# Patient Record
Sex: Male | Born: 2000 | Race: White | Hispanic: No | Marital: Single | Smoking: Never smoker
Health system: Southern US, Community
[De-identification: ages and names within clinical notes are randomized; demographics above are authoritative.]

## PROBLEM LIST (undated history)

## (undated) DIAGNOSIS — F909 Attention-deficit hyperactivity disorder, unspecified type: Secondary | ICD-10-CM

---
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
## 2018-04-17 ENCOUNTER — Ambulatory Visit (INDEPENDENT_AMBULATORY_CARE_PROVIDER_SITE_OTHER): Payer: PRIVATE HEALTH INSURANCE | Admitting: Family Medicine

## 2018-04-17 ENCOUNTER — Encounter: Payer: Self-pay | Admitting: Family Medicine

## 2018-04-17 VITALS — BP 120/82 | HR 87 | Temp 98.6°F | Wt 152.0 lb

## 2018-04-17 DIAGNOSIS — L723 Sebaceous cyst: Secondary | ICD-10-CM

## 2018-04-17 NOTE — Progress Notes (Signed)
   Subjective:    Patient ID: Keith Banks, male    DOB: 2000-05-27, 18 y.o.   MRN: [id number]  HPI He is here for evaluation of the lesion present in the upper gluteal cleft area.  It is slightly uncomfortable and has gotten a little bit bigger in the last several days.   Review of Systems     Objective:   Physical Exam 1-1/2 cm round smooth reddish-purple lesion with no surrounding induration is noted in the gluteal crease superiorly.       Assessment & Plan:  Sebaceous cyst The area was frozen with ethyl chloride and a 1 cm incision was made.  No purulent material was expressed but a whitish-yellow lesion was extruded from the area.  It appeared to be a cystic type lesion. Recommend irrigating this several times per day and return here if further problems. I did not feel as if this was a pilonidal cyst but that certainly a possibility.  It was very superficial.

## 2018-05-04 ENCOUNTER — Telehealth: Payer: Self-pay | Admitting: Family Medicine

## 2018-05-04 NOTE — Telephone Encounter (Signed)
Received a response concerning medical records request. Per fax no records available. Unable to locate pt.

## 2018-05-24 ENCOUNTER — Telehealth: Payer: Self-pay | Admitting: Family Medicine

## 2018-05-24 NOTE — Telephone Encounter (Signed)
Received requested records from [facility]. Sending back for review.

## 2018-05-29 ENCOUNTER — Ambulatory Visit (INDEPENDENT_AMBULATORY_CARE_PROVIDER_SITE_OTHER): Payer: PRIVATE HEALTH INSURANCE | Admitting: Family Medicine

## 2018-05-29 ENCOUNTER — Encounter: Payer: Self-pay | Admitting: Family Medicine

## 2018-05-29 VITALS — BP 120/90 | HR 102 | Temp 102.0°F | Resp 16 | Ht 67.5 in | Wt 159.8 lb

## 2018-05-29 DIAGNOSIS — F909 Attention-deficit hyperactivity disorder, unspecified type: Secondary | ICD-10-CM | POA: Diagnosis not present

## 2018-05-29 MED ORDER — METHYLPHENIDATE HCL 10 MG PO TABS: 10.0000 mg | ORAL_TABLET | Freq: Two times a day (BID) | ORAL | 0 refills | Status: AC

## 2018-05-29 NOTE — Progress Notes (Signed)
   Subjective:    Patient ID: Keith Banks, male    DOB: [birth year], 18 y.o.   MRN: [id number]  HPI He is here for consult concerning ADHD.  He apparently was diagnosed while in grade school and from grade 3-12 was on Vyvanse.  He did have unacceptable side effects of that medicine causing him to be moody and did not continue on it.  High school was not a problem as he got B's and did not have to try.  He is now in college and notes he has difficulty completing tasks.  He gets easily distracted.   Review of Systems     Objective:   Physical Exam Alert and in no distress otherwise not examined Adult ADHD score of 44      Assessment & Plan:  Attention deficit hyperactivity disorder (ADHD), unspecified ADHD type - Plan: methylphenidate (RITALIN) 10 MG tablet I will start him out on Ritalin and stay away from Adderall at least at this point.  He is to let me know if it works, how long it works and if it has any side effects.  Discussed to use this at least a half an hour before going to classes and be careful to not drink high acid soft drinks.

## 2018-05-29 NOTE — Patient Instructions (Signed)
Start on the medicine and the need to know if it works, how long and he has any side effects

## 2019-01-16 ENCOUNTER — Encounter: Payer: Self-pay | Admitting: Family Medicine

## 2019-01-16 ENCOUNTER — Other Ambulatory Visit: Payer: Self-pay

## 2019-01-16 ENCOUNTER — Ambulatory Visit (INDEPENDENT_AMBULATORY_CARE_PROVIDER_SITE_OTHER): Payer: PRIVATE HEALTH INSURANCE | Admitting: Family Medicine

## 2019-01-16 VITALS — BP 120/70 | HR 88 | Temp 98.7°F | Wt 164.4 lb

## 2019-01-16 DIAGNOSIS — Z9889 Other specified postprocedural states: Secondary | ICD-10-CM

## 2019-01-16 NOTE — Progress Notes (Signed)
   Subjective:    Patient ID: Keith Banks, male    DOB: Jul 04, 2000, 18 y.o.   MRN: [id number]  HPI He is here for consult concerning recent surgery for pilonidal abscess.  He had surgery done on this at the end of July.  They did use a wound VAC on that and then switched him to wet-to-dry dressings as well as twice daily irrigation.  He was also placed on antibiotics for this.  This was done in [place]   Review of Systems     Objective:   Physical Exam Alert and in no distress.  Exam of the wound does show a 6 to 7 cm wound in the gluteal cleft.  It was pulled apart to get a better view and minimal bleeding did occur.  On a superior end of it it was much more tender with a small amount of drainage.       Assessment & Plan:  History of excision of pilonidal cyst - Plan: Ambulatory referral to General Surgery There was a small amount of purulent material at the very superior end of this which was tender.  The rest of the wound looks like it is healing fairly well but does need to be evaluated more thoroughly by surgery.

## 2019-01-19 ENCOUNTER — Telehealth: Payer: Self-pay | Admitting: Family Medicine

## 2019-01-19 NOTE — Telephone Encounter (Signed)
PT called and wanted to get the name of the place he will be having surgery. Looks like referral has not been completed . spoke to [place] who stated that it may be [facility]. Pt informed

## 2019-01-19 NOTE — Telephone Encounter (Signed)
Pt called concerning his referral. He wanted to advise that [facility] is not in his network. Pt can be reached at [phone].

## 2019-01-19 NOTE — Telephone Encounter (Signed)
Pt called back and states that [facility] is in network per his insurance

## 2021-01-06 DIAGNOSIS — L0591 Pilonidal cyst without abscess: Secondary | ICD-10-CM | POA: Diagnosis not present

## 2021-01-26 DIAGNOSIS — F418 Other specified anxiety disorders: Secondary | ICD-10-CM | POA: Diagnosis not present

## 2021-01-26 DIAGNOSIS — L309 Dermatitis, unspecified: Secondary | ICD-10-CM | POA: Diagnosis not present

## 2021-01-27 DIAGNOSIS — L988 Other specified disorders of the skin and subcutaneous tissue: Secondary | ICD-10-CM | POA: Diagnosis not present

## 2021-04-27 ENCOUNTER — Ambulatory Visit: Payer: Self-pay | Admitting: Surgery

## 2021-04-27 DIAGNOSIS — L988 Other specified disorders of the skin and subcutaneous tissue: Secondary | ICD-10-CM | POA: Diagnosis not present

## 2021-05-08 DIAGNOSIS — L0591 Pilonidal cyst without abscess: Secondary | ICD-10-CM | POA: Diagnosis not present

## 2021-05-08 DIAGNOSIS — L0592 Pilonidal sinus without abscess: Secondary | ICD-10-CM | POA: Diagnosis not present

## 2021-05-29 DIAGNOSIS — L988 Other specified disorders of the skin and subcutaneous tissue: Secondary | ICD-10-CM | POA: Diagnosis not present
